# Patient Record
Sex: Female | Born: 2000
Health system: Southern US, Community
[De-identification: ages and names within clinical notes are randomized; demographics above are authoritative.]

---
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
## 2020-09-13 DIAGNOSIS — M791 Myalgia, unspecified site: Secondary | ICD-10-CM | POA: Diagnosis not present

## 2020-09-13 DIAGNOSIS — M9906 Segmental and somatic dysfunction of lower extremity: Secondary | ICD-10-CM | POA: Diagnosis not present

## 2020-09-17 DIAGNOSIS — M9906 Segmental and somatic dysfunction of lower extremity: Secondary | ICD-10-CM | POA: Diagnosis not present

## 2020-09-17 DIAGNOSIS — M791 Myalgia, unspecified site: Secondary | ICD-10-CM | POA: Diagnosis not present

## 2021-08-02 DIAGNOSIS — N926 Irregular menstruation, unspecified: Secondary | ICD-10-CM | POA: Diagnosis not present

## 2021-09-19 DIAGNOSIS — D2262 Melanocytic nevi of left upper limb, including shoulder: Secondary | ICD-10-CM | POA: Diagnosis not present

## 2021-09-19 DIAGNOSIS — D2239 Melanocytic nevi of other parts of face: Secondary | ICD-10-CM | POA: Diagnosis not present

## 2021-09-19 DIAGNOSIS — D2261 Melanocytic nevi of right upper limb, including shoulder: Secondary | ICD-10-CM | POA: Diagnosis not present

## 2021-09-19 DIAGNOSIS — D224 Melanocytic nevi of scalp and neck: Secondary | ICD-10-CM | POA: Diagnosis not present

## 2022-04-28 DIAGNOSIS — R350 Frequency of micturition: Secondary | ICD-10-CM | POA: Diagnosis not present

## 2022-09-30 DIAGNOSIS — Z124 Encounter for screening for malignant neoplasm of cervix: Secondary | ICD-10-CM | POA: Diagnosis not present

## 2022-09-30 DIAGNOSIS — N912 Amenorrhea, unspecified: Secondary | ICD-10-CM | POA: Diagnosis not present

## 2022-09-30 DIAGNOSIS — R87810 Cervical high risk human papillomavirus (HPV) DNA test positive: Secondary | ICD-10-CM | POA: Diagnosis not present

## 2022-10-14 DIAGNOSIS — B977 Papillomavirus as the cause of diseases classified elsewhere: Secondary | ICD-10-CM | POA: Diagnosis not present

## 2022-10-14 DIAGNOSIS — Z3040 Encounter for surveillance of contraceptives, unspecified: Secondary | ICD-10-CM | POA: Diagnosis not present

## 2022-10-14 DIAGNOSIS — N870 Mild cervical dysplasia: Secondary | ICD-10-CM | POA: Diagnosis not present

## 2022-10-14 DIAGNOSIS — R87619 Unspecified abnormal cytological findings in specimens from cervix uteri: Secondary | ICD-10-CM | POA: Diagnosis not present

## 2022-10-14 DIAGNOSIS — N912 Amenorrhea, unspecified: Secondary | ICD-10-CM | POA: Diagnosis not present

## 2022-10-27 DIAGNOSIS — R87619 Unspecified abnormal cytological findings in specimens from cervix uteri: Secondary | ICD-10-CM | POA: Diagnosis not present

## 2023-06-18 ENCOUNTER — Ambulatory Visit (INDEPENDENT_AMBULATORY_CARE_PROVIDER_SITE_OTHER)

## 2023-06-18 ENCOUNTER — Ambulatory Visit: Admitting: Podiatry

## 2023-06-18 DIAGNOSIS — M21621 Bunionette of right foot: Secondary | ICD-10-CM

## 2023-06-18 DIAGNOSIS — M2041 Other hammer toe(s) (acquired), right foot: Secondary | ICD-10-CM

## 2023-06-18 DIAGNOSIS — M21622 Bunionette of left foot: Secondary | ICD-10-CM

## 2023-06-18 DIAGNOSIS — M7742 Metatarsalgia, left foot: Secondary | ICD-10-CM

## 2023-06-18 DIAGNOSIS — M7741 Metatarsalgia, right foot: Secondary | ICD-10-CM | POA: Diagnosis not present

## 2023-06-18 DIAGNOSIS — M2042 Other hammer toe(s) (acquired), left foot: Secondary | ICD-10-CM

## 2023-06-18 NOTE — Progress Notes (Signed)
  Subjective:  Patient ID: Angie Nichols, female    DOB: 26-Jul-2000,  MRN: [id number] HPI Chief Complaint  Patient presents with   Foot Pain    RM#7 Bilateral foot pain worsening in the last 8 months .    23 y.o. female presents with the above complaint.   ROS: Denies fever chills nausea vomiting muscle aches pains calf pain back pain chest pain shortness of breath.  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  She states that I cannot get shoes wide enough to for the forefoot that fifth the rear foot.  Anti-inflammatories have failed to render her asymptomatic as well as shoe gear changes.  She has trimmed the calluses and again no relief  No past medical history on file. No past surgical history on file. No current outpatient medications on file.  No Known Allergies Review of Systems Objective:  There were no vitals filed for this visit.  General: Well developed, nourished, in no acute distress, alert and oriented x3   Dermatological: Skin is warm, dry and supple bilateral. Nails x 10 are well maintained; remaining integument appears unremarkable at this time. There are no open sores, no preulcerative lesions, no rash or signs of infection present.  Painful calluses lateral fifth met heads  Vascular: Dorsalis Pedis artery and Posterior Tibial artery pedal pulses are 2/4 bilateral with immedate capillary fill time. Pedal hair growth present. No varicosities and no lower extremity edema present bilateral.   Neruologic: Grossly intact via light touch bilateral. Vibratory intact via tuning fork bilateral. Protective threshold with Semmes Wienstein monofilament intact to all pedal sites bilateral. Patellar and Achilles deep tendon reflexes 2+ bilateral. No Babinski or clonus noted bilateral.    Musculoskeletal: No gross boney pedal deformities bilateral. No pain, crepitus, or limitation noted with foot and ankle range of motion bilateral. Muscular strength 5/5 in all groups tested bilateral.  Painful calluses are also noted fifth met head  Gait: Unassisted, Nonantalgic.    Radiographs:  Radiographs taken today demonstrate osseously mature individual very mild bunion deformities but moderate to severe tailor's bunion deformities.  Right greater than left.  She also demonstrates an adductovarus rotated hammertoe deformity fifth left and right.  She also demonstrates thickening of the soft tissue lateral to the fifth metatarsal heads.  Assessment & Plan:   Assessment: Tailor's bunion deformities painful and recalcitrant.  Conservative therapies have failed to render her asymptomatic.  Plan: Discussed surgical intervention today consisting of fifth metatarsal osteotomy and a derotational arthroplasty fifth toe.  She understands this and is amenable to it would like to consider this soon as possible.  We consented her for fifth met osteotomy with double screw fixation and derotational arthroplasty right foot.  She understands possible side effects and complications associated with this procedure which may include but not limited to postop pain bleeding swelling infection recurrence need for further surgery overcorrection under correction loss of digit loss of limb loss of life.  We discussed the surgery center as well as anesthesia     Jyasia Markoff T. [place], [place]

## 2023-07-02 ENCOUNTER — Telehealth: Payer: Self-pay | Admitting: Podiatry

## 2023-07-02 NOTE — Telephone Encounter (Signed)
 Notified pt that Dr Lara Plants said to wait on the surgery. She is keeping the [employer].

## 2023-07-02 NOTE — Telephone Encounter (Signed)
 Pt left message for a call back about moving surgery to sooner. Pt was scheduled for surgery on 09/04/23 but is having a lot of pain and wanting to get it sooner. She asked for asap I told her possibly 6/6 and she asked about a trip she has planned. She has a flight on 07/25/23 to [place]  would she be ok to travel that soon after surgery. She is concerned about being able to elevate it.   She also asked for a Quote and I forwarded that to billing dept and surgery center.

## 2023-07-06 ENCOUNTER — Telehealth: Payer: Self-pay | Admitting: Podiatry

## 2023-07-06 NOTE — Telephone Encounter (Signed)
 Pt left message with no information on Friday stating she got a call to confirm her surgery was moved to 6/6 and she wanted to keep it for 8/1.   I confirmed with surgery center last Friday 5/30.  Surgery was moved back to 8/1.  I lvm for pt that surgery was scheduled for 09/04/23

## 2023-08-18 ENCOUNTER — Telehealth: Payer: Self-pay | Admitting: Podiatry

## 2023-08-18 NOTE — Telephone Encounter (Signed)
 Pt left message with questions about her surgery that was scheduled for 09/04/23.  I returned call and pt has started a new job and is not going to be able to take off of work. She is canceling the surgery for 8/1 and would like to possibly have it done in may 2026.  I told pt to call me in January and I would get her scheduled to come in to be consulted again and sign a new consent so we could get the surgery canceled.

## 2023-08-19 ENCOUNTER — Encounter: Payer: Self-pay | Admitting: Physician Assistant

## 2023-09-01 ENCOUNTER — Telehealth: Payer: Self-pay | Admitting: Podiatry

## 2023-09-01 NOTE — Telephone Encounter (Signed)
 DOS- 09/04/2023  5TH RT METATARSAL BLOCK OSTEOTOMY- 28308 5TH RT HAMMERTOE REPAIR- [id number]  [facility] EFFECTIVE DATE- 02/04/2023  DEDUCTIBLE- $1000 REMAINING- $794.43 FAMILY DEDUCTIBLE- $2000 REMAINING- $1794.43 OOP- $6000 REMAINING- $4958.16 COINSURANCE- 75% AFTER MET DEDUCTIBLE  BENEFIT INQUIRY REF #- NICOLE [id number]  PER [facility] WEBSITE, NO PRIOR AUTH REQUIRED FOR CPT CODES 71691 AND [phone]. DOCUMENTATION ATTACHED TO SURGERY CONSENT PACKET.

## 2023-09-10 ENCOUNTER — Encounter: Admitting: Podiatry

## 2023-09-24 ENCOUNTER — Encounter: Admitting: Podiatry

## 2023-10-12 ENCOUNTER — Ambulatory Visit: Admitting: Physician Assistant

## 2023-10-15 ENCOUNTER — Encounter: Admitting: Podiatry

## 2023-11-22 ENCOUNTER — Other Ambulatory Visit: Payer: Self-pay | Admitting: Medical Genetics
